# Patient Record
Sex: Female | Born: 1990 | Hispanic: Yes | Marital: Single | Smoking: Never smoker
Health system: Southern US, Community
[De-identification: ages and names within clinical notes are randomized; demographics above are authoritative.]

## PROBLEM LIST (undated history)

## (undated) ENCOUNTER — Inpatient Hospital Stay: Payer: Self-pay

---

## 2014-06-05 ENCOUNTER — Emergency Department
Admission: EM | Admit: 2014-06-05 | Discharge: 2014-06-06 | Disposition: A | Discharge status: Home / Self Care | Payer: Medicaid Other | Attending: Emergency Medicine | Admitting: Emergency Medicine

## 2014-06-05 ENCOUNTER — Encounter: Payer: Self-pay | Admitting: Emergency Medicine

## 2014-06-05 ENCOUNTER — Emergency Department: Payer: Medicaid Other

## 2014-06-05 DIAGNOSIS — W540XXA Bitten by dog, initial encounter: Secondary | ICD-10-CM | POA: Diagnosis not present

## 2014-06-05 DIAGNOSIS — Y9289 Other specified places as the place of occurrence of the external cause: Secondary | ICD-10-CM | POA: Diagnosis not present

## 2014-06-05 DIAGNOSIS — S81852A Open bite, left lower leg, initial encounter: Secondary | ICD-10-CM | POA: Diagnosis present

## 2014-06-05 DIAGNOSIS — S81832A Puncture wound without foreign body, left lower leg, initial encounter: Secondary | ICD-10-CM | POA: Diagnosis not present

## 2014-06-05 DIAGNOSIS — Y998 Other external cause status: Secondary | ICD-10-CM | POA: Insufficient documentation

## 2014-06-05 DIAGNOSIS — Y9389 Activity, other specified: Secondary | ICD-10-CM | POA: Diagnosis not present

## 2014-06-05 DIAGNOSIS — Z23 Encounter for immunization: Secondary | ICD-10-CM | POA: Diagnosis not present

## 2014-06-05 DIAGNOSIS — T07XXXA Unspecified multiple injuries, initial encounter: Secondary | ICD-10-CM

## 2014-06-05 MED ORDER — IBUPROFEN 800 MG PO TABS: 800.0000 mg | ORAL_TABLET | Freq: Three times a day (TID) | ORAL | Status: AC | PRN

## 2014-06-05 MED ORDER — AMOXICILLIN-POT CLAVULANATE 875-125 MG PO TABS
1.0000 | ORAL_TABLET | Freq: Once | ORAL | Status: AC
  Administered 2014-06-05: 1 via ORAL

## 2014-06-05 MED ORDER — AMOXICILLIN-POT CLAVULANATE 875-125 MG PO TABS
ORAL_TABLET | ORAL | Status: AC
  Administered 2014-06-05: 1 via ORAL
  Filled 2014-06-05: qty 1

## 2014-06-05 MED ORDER — RABIES VACCINE, PCEC IM SUSR
INTRAMUSCULAR | Status: AC
  Administered 2014-06-05: 1 mL via INTRAMUSCULAR
  Filled 2014-06-05: qty 1

## 2014-06-05 MED ORDER — AMOXICILLIN-POT CLAVULANATE 875-125 MG PO TABS: 1.0000 | ORAL_TABLET | Freq: Two times a day (BID) | ORAL | Status: AC

## 2014-06-05 MED ORDER — RABIES VACCINE, PCEC IM SUSR
1.0000 mL | Freq: Once | INTRAMUSCULAR | Status: AC
  Administered 2014-06-05: 1 mL via INTRAMUSCULAR

## 2014-06-05 MED ORDER — IBUPROFEN 800 MG PO TABS
800.0000 mg | ORAL_TABLET | Freq: Once | ORAL | Status: AC
  Administered 2014-06-05: 800 mg via ORAL

## 2014-06-05 MED ORDER — RABIES IMMUNE GLOBULIN 150 UNIT/ML IM INJ
20.0000 [IU]/kg | INJECTION | Freq: Once | INTRAMUSCULAR | Status: AC
  Administered 2014-06-05: 1425 [IU] via INTRAMUSCULAR
  Filled 2014-06-05: qty 9.5

## 2014-06-05 MED ORDER — IBUPROFEN 800 MG PO TABS
ORAL_TABLET | ORAL | Status: AC
  Administered 2014-06-05: 800 mg via ORAL
  Filled 2014-06-05: qty 1

## 2014-06-05 NOTE — ED Provider Notes (Signed)
[facility] Emergency Department Provider Note    ____________________________________________  Time seen: 2230 I have reviewed the triage vital signs and the nursing notes.   HISTORY  Chief Complaint Animal Bite       HPI Caroline SparrowClaudia Garay is a 24 y.o. female who was bitten by a stray dog to the left leg just prior to arrival in the department has multiple puncture wounds to her left leg no bleeding at this time rates pain as about a 4-5 out of 10 at the most only hurts when she moves it relieved by just sitting still she cleaned and dressed the wound prior to arrival and has no other associated signs or symptoms is moving all extremities without difficulty     History reviewed. No pertinent past medical history.  There are no active problems to display for this patient.   Past Surgical History  Procedure Laterality Date  . Cesarean section      Current Outpatient Rx  Name  Route  Sig  Dispense  Refill  . amoxicillin-clavulanate (AUGMENTIN) 875-125 MG per tablet   Oral   Take 1 tablet by mouth 2 (two) times daily.   14 tablet   0   . ibuprofen (ADVIL,MOTRIN) 800 MG tablet   Oral   Take 1 tablet (800 mg total) by mouth every 8 (eight) hours as needed.   30 tablet   0     Allergies Review of patient's allergies indicates no known allergies.  History reviewed. No pertinent family history.  Social History History  Substance Use Topics  . Smoking status: Never Smoker   . Smokeless tobacco: Not on file  . Alcohol Use: No    Review of Systems  He is systems negative 6 systems as reviewed the patient's except for noted the history of present illness  ____________________________________________   PHYSICAL EXAM:  VITAL SIGNS: ED Triage Vitals  Enc Vitals Group     BP 06/05/14 2120 110/70 mmHg     Pulse Rate 06/05/14 2120 73     Resp 06/05/14 2120 20     Temp 06/05/14 2120 98.2 F (36.8 C)     Temp Source 06/05/14 2120 Oral      SpO2 06/05/14 2120 100 %     Weight 06/05/14 2120 154 lb (69.854 kg)     Height 06/05/14 2120 5\' 5"  (1.651 m)     Head Cir --      Peak Flow --      Pain Score 06/05/14 2121 5     Pain Loc --      Pain Edu? --      Excl. in GC? --    This was an Hispanic female appearing stated age well-developed well-nourished no acute distress vitals reviewed with nurse's head ears eyes nose and throat exam was negative  cardiovascular regular rate and rhythm no murmurs rubs gallops  pulmonary lungs process patient bilaterally musculoskeletal she has full range of motion all extremities multiple puncture wounds to the dorsal aspect of the left calf Achilles is intact she has full range of motion good sensations able to [facility] without difficulty Neuro exam is nonfocal good sensation strength equal and symmetrical bilaterally Psychologically no acute distress acting normal Skin multiple puncture wounds to the left leg    RADIOLOGY  For the patient's left leg was negative for foreign body or bony abnormality  ____________________________________________   PROCEDURES  Procedure(s) performed: None  ____________________________________________   INITIAL IMPRESSION / ASSESSMENT AND PLAN /  ED COURSE  Pertinent labs & imaging results that were available during my care of the patient were reviewed by me and considered in my medical decision making (see chart for details).  Impression dog bite multiple puncture wounds of the left leg rabies series was started patient was started on Augmentin is to follow-up for completion rabies series and complete all antibiotics  ____________________________________________   FINAL CLINICAL IMPRESSION(S) / ED DIAGNOSES  Final diagnoses:  Dog bite  Multiple puncture wounds     Graeden Bitner Rosalyn GessWilliam C Jorgina Binning, PA-C 06/05/14 2255  Sharman CheekPhillip Stafford, MD 06/06/14 0040

## 2014-06-05 NOTE — Discharge Instructions (Signed)
Mordedura de Engineer, maintenanceanimales  (LobbyistAnimal Bite) La mordedura de Corporate investment bankerun animal puede producir un rasguo de la piel, un corte abierto profundo, una puncin, una lesin por compresin o un desgarro de la piel o de una parte del cuerpo. Los perros son los responsables de la mayora de las mordeduras. Los nios son atacados con ms frecuencia que los adultos. La mordedura de un animal puede ser leve o llegar a ser grave. Una mordedura pequea causada por una mascota no es causa de alarma. Sin embargo, algunas pueden infectarse o llegar a Engineer, drillinglesionar un hueso u otros tejidos. Debe solicitar asistencia mdica si:   La piel est abierta y el sangrado no se detiene ni disminuye despus de 15 minutos.  La puncin es profunda y difcil de limpiar (como en el caso de la mordedura de un Hager Citygato).  La herida le duele, est caliente, roja o supura pus.  La mordedura la hizo un Haematologistanimal callejero o un roedor. Puede haber riesgo de infeccin por rabia.  La mordedura la hizo una serpiente, un mapache, zorrino, Chief Financial Officerzorro, Tour managercoyote o murcilago. Puede haber riesgo de infeccin por rabia.  La persona que sufri la mordedura tiene una enfermedad crnica como diabetes, enfermedad heptica o cncer, o toma medicamentos que disminuyen la accin del sistema inmunolgico.  Hay preocupacin por la ubicacin y la gravedad de la mordedura. Es importante limpiar y proteger la zona de la herida inmediatamente, para evitar la infeccin. Siga estos pasos:   Limpie la herida con abundante agua y [place]jabn.  Baruch Goutyubra con una crema con antibitico.  Aplique una suave presin sobre la herida con una toalla o una gasa limpia para disminuir o detener el sangrado.  Eleve la zona afectada por arriba del nivel del corazn para Associate Professordetener hemorragias.  Pida ayuda mdica. Si recibe asistencia mdica dentro de las 8 horas de la Entergy Corporationmordedura los resultados sern mejores. DIAGNSTICO  El mdico:   Tomar una historia detallada del animal y de la lesin por la  mordedura.  Har un examen de la herida.  Realizar una historia clnica. Indicar anlisis o radiografas. En algunos casos se toma una muestra de la herida infectada y se enva al laboratorio para identificar la bacteria que produjo la infeccin.  TRATAMIENTO  El tratamiento mdico depender de la ubicacin y el tipo de mordedura, as como de la historia clnica del [place]paciente. El tratamiento podr incluir:   Cuidados de la herida, como limpieza y enjuague con solucin fisiolgica, vendaje y la elevacin de la zona afectada.  Antibiticos.  Vacuna antitetnica.  Madilyn FiremanVacuna antirrbica.  Dejar la herida abierta para que se cure. Generalmente sto se hace debido al riesgo de infeccin. Sin embargo, en ciertos casos, la herida se cierra con puntos, Turner Danielsadhesivo para heridas, tiras GNFAOZHYQadhesivas para la piel o grapas. Las heridas infectadas que no se tratan pueden requerir antibiticos por va intravenosa (IV) y tratamiento quirrgico en el hospital.  INSTRUCCIONES PARA EL CUIDADO EN EL HOGAR   Siga las indicaciones del profesional para el cuidado de las heridas.  [employer] todos los medicamentos tal como se los han indicado.  Si el profesional que lo asiste le prescribe antibiticos, tmelos tal como se le indic. Tmelos todos, aunque se sienta mejor.  Concurra a las visitas de control con el mdico para Wellsite geologistrealizar pruebas adicionales, o recibir vacunas, segn las indicaciones. Deber aplicarse la vacuna contra el ttanos si:  No recuerda cundo se coloc la vacuna la ltima vez.  Nunca recibi esta vacuna.  La lesin ha Air Products and Chemicalsabierto su  piel. Si le han aplicado la vacuna contra el ttanos, el brazo podr hincharse, enrojecer y sentirse caliente al tacto. Esto es frecuente y no es un problema. Si usted necesita aplicarse la vacuna y se niega a recibirla, corre riesgo de contraer ttanos. Esta es una enfermedad que puede ser grave. SOLICITE ATENCIN MDICA SI:   La herida est caliente, roja, hinchada, le  duele, supura pus o tiene Reynolds Americanfeo olor.  Hay una lnea roja en la piel que sale desde la herida.  Tiene fiebre, escalofros, o una sensacin general de Dentistmalestar.  Tiene nuseas o vmitos.  Siente dolor continuo o que Abbs Valleyempeora.  Tiene dificultad para mover la zona lesionada.  Tiene otras preguntas o preocupaciones. ASEGRESE DE QUE:   Comprende estas instrucciones.  Controlar su enfermedad.  Solicitar ayuda de inmediato si no mejora o si empeora. Document Released: 01/03/2011 Document Revised: 04/08/2011 [facility] Patient Information 2015 [employer], [place]. This information is not intended to replace advice given to you by your health care provider. Make sure you discuss any questions you have with your health care provider.

## 2014-06-05 NOTE — ED Notes (Signed)
Pt presents to ER alert and in NAD. Via interpreter pt states she was getting in car and was attacked by stray dog. Pt has various bite marks noted to left calf. Bleeding controlled.

## 2014-06-06 MED ORDER — BACITRACIN-NEOMYCIN-POLYMYXIN 400-5-5000 EX OINT
TOPICAL_OINTMENT | CUTANEOUS | Status: AC
  Filled 2014-06-06: qty 1

## 2014-09-28 DIAGNOSIS — Z0279 Encounter for issue of other medical certificate: Secondary | ICD-10-CM

## 2015-02-16 ENCOUNTER — Other Ambulatory Visit: Payer: Self-pay | Admitting: Advanced Practice Midwife

## 2015-02-16 DIAGNOSIS — Z3481 Encounter for supervision of other normal pregnancy, first trimester: Secondary | ICD-10-CM

## 2015-02-21 ENCOUNTER — Ambulatory Visit
Admission: RE | Admit: 2015-02-21 | Discharge: 2015-02-21 | Disposition: A | Discharge status: Home / Self Care | Payer: Medicaid Other | Source: Ambulatory Visit | Attending: Advanced Practice Midwife | Admitting: Advanced Practice Midwife

## 2015-02-21 DIAGNOSIS — Z3481 Encounter for supervision of other normal pregnancy, first trimester: Secondary | ICD-10-CM | POA: Insufficient documentation

## 2015-02-21 DIAGNOSIS — Z3A13 13 weeks gestation of pregnancy: Secondary | ICD-10-CM | POA: Insufficient documentation

## 2015-02-22 DIAGNOSIS — R87619 Unspecified abnormal cytological findings in specimens from cervix uteri: Secondary | ICD-10-CM | POA: Insufficient documentation

## 2015-04-16 ENCOUNTER — Observation Stay
Admission: EM | Admit: 2015-04-16 | Discharge: 2015-04-16 | Disposition: A | Discharge status: Home / Self Care | Payer: Medicaid Other | Attending: Obstetrics & Gynecology | Admitting: Obstetrics & Gynecology

## 2015-04-16 ENCOUNTER — Encounter: Payer: Self-pay | Admitting: *Deleted

## 2015-04-16 DIAGNOSIS — O2342 Unspecified infection of urinary tract in pregnancy, second trimester: Principal | ICD-10-CM | POA: Insufficient documentation

## 2015-04-16 DIAGNOSIS — O26892 Other specified pregnancy related conditions, second trimester: Secondary | ICD-10-CM | POA: Diagnosis present

## 2015-04-16 DIAGNOSIS — Z3A22 22 weeks gestation of pregnancy: Secondary | ICD-10-CM | POA: Insufficient documentation

## 2015-04-16 DIAGNOSIS — M545 Low back pain, unspecified: Secondary | ICD-10-CM | POA: Diagnosis present

## 2015-04-16 LAB — URINALYSIS COMPLETE WITH MICROSCOPIC (ARMC ONLY)
BILIRUBIN URINE: NEGATIVE
Glucose, UA: NEGATIVE mg/dL
Nitrite: NEGATIVE
PROTEIN: NEGATIVE mg/dL
Specific Gravity, Urine: 1.025 (ref 1.005–1.030)
pH: 5 (ref 5.0–8.0)

## 2015-04-16 MED ORDER — ACETAMINOPHEN 325 MG PO TABS: 650.0000 mg | ORAL_TABLET | ORAL | Status: DC | PRN

## 2015-04-16 NOTE — Discharge Instructions (Signed)
Patient is agreeable to call on Monday  to schedule an appointment with her provider [facility].  If patient has any further questions or concerns she can call the on-call physician or the nurse's desk.

## 2015-04-16 NOTE — Final Progress Note (Signed)
Physician Final Progress Note  Patient ID: Jeanell SparrowClaudia Garay MRN: [id number] DOB/AGE: 24/04/1990 24 y.o.  Admit date: 04/16/2015 Admitting provider: Nadara Mustardobert P Shannen Flans[place], MD Discharge date: 04/16/2015   Admission Diagnoses: Low back pain in second trimester  Discharge Diagnoses:  Principal Problem:   Low back pain during pregnancy in second trimester  Significant Findings/ Diagnostic Studies: labs: See Urinalysis results  Procedures: None  Discharge Condition: good  Disposition: 01-Home or Self Care  Diet: Regular diet  Discharge Activity: Activity as tolerated     Medication List    ASK your doctor about these medications        Tylenol for pain Macrobid Antibiotic for urinary tract infection.            Follow-up Information    [facility] Go in 1 week.   Why:  As Scheduled   Contact information:   [address] [place] [id number] [phone]       Total time spent taking care of this patient: 15 minutes  Signed: Letitia Libraobert Paul Tamberlyn Midgley 04/16/2015, 9:59 AM

## 2015-07-21 DIAGNOSIS — E663 Overweight: Secondary | ICD-10-CM | POA: Insufficient documentation

## 2016-02-19 ENCOUNTER — Encounter (HOSPITAL_COMMUNITY): Payer: Self-pay

## 2017-03-02 IMAGING — US US OB COMP LESS 14 WK
2 series · 14 of 28 positions shown · non-contrast
Comparison: No prior.

CLINICAL DATA: Pregnancy.

EXAM:
OBSTETRIC <14 WK US AND TRANSVAGINAL OB US
TECHNIQUE: Both transabdominal and transvaginal ultrasound examinations were
performed for complete evaluation of the gestation as well as the
maternal uterus, adnexal regions, and pelvic cul-de-sac.
Transvaginal technique was performed to assess early pregnancy.

[Series 1: us ob comp less 14 wk · 0.30mm/px · 13 of 33 slices shown (1 of 2)]
[im 2/33]
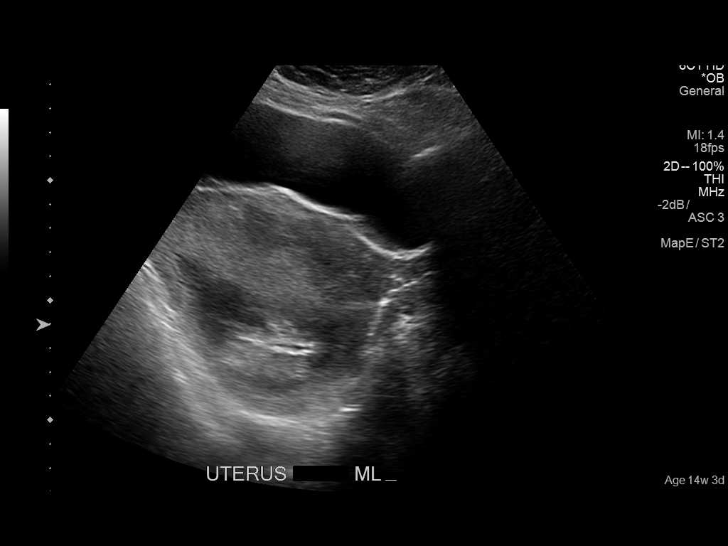
[im 4/33]
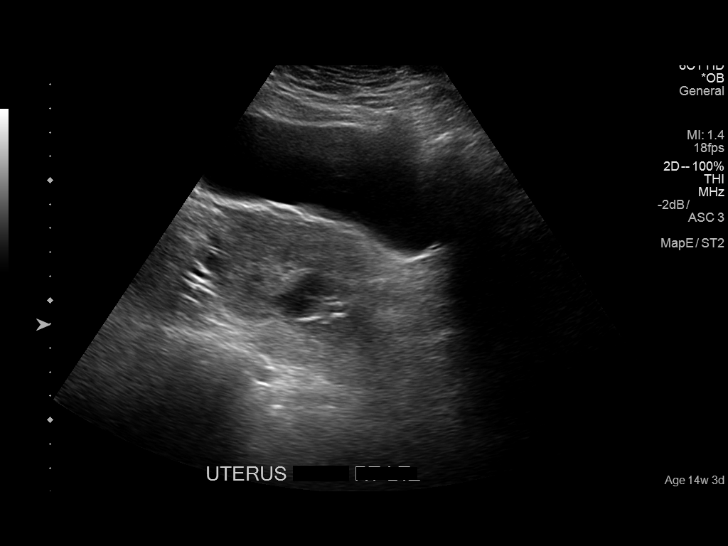
[im 7/33]
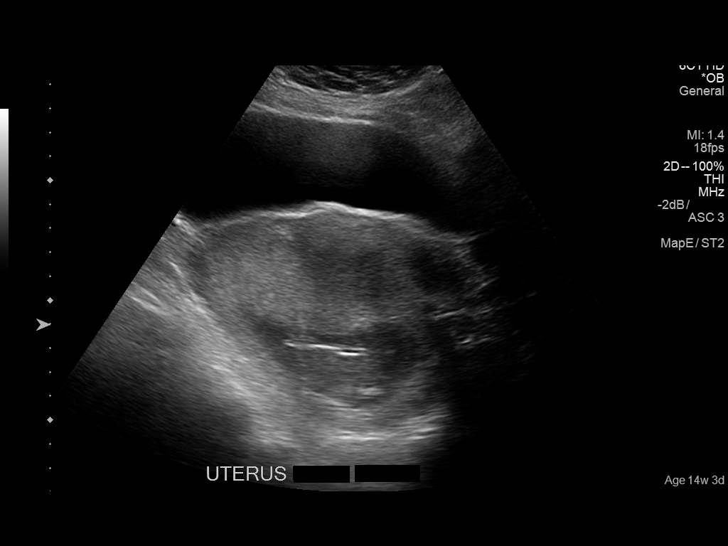
[im 9/33]
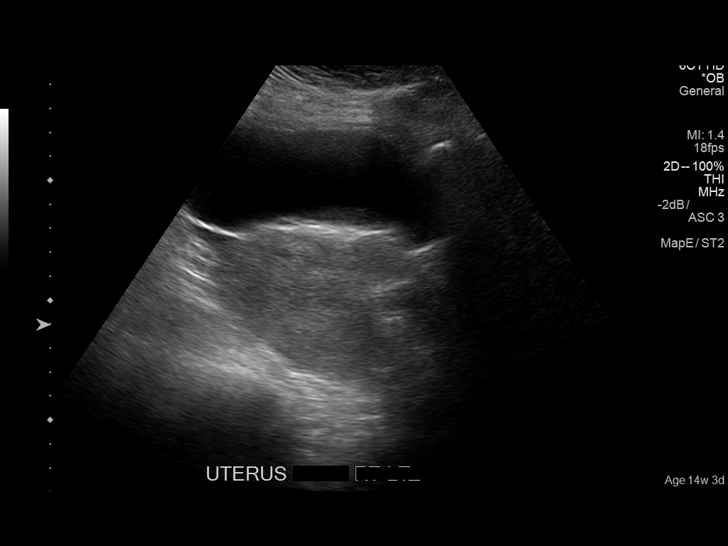
[im 12/33]
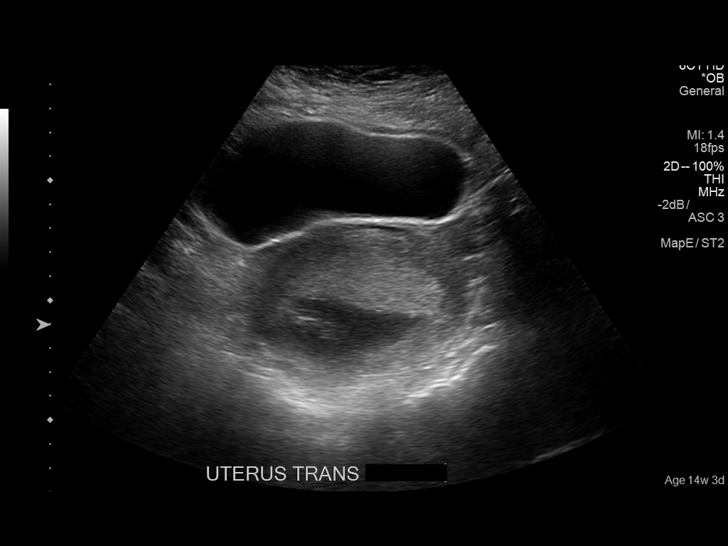
[im 15/33]
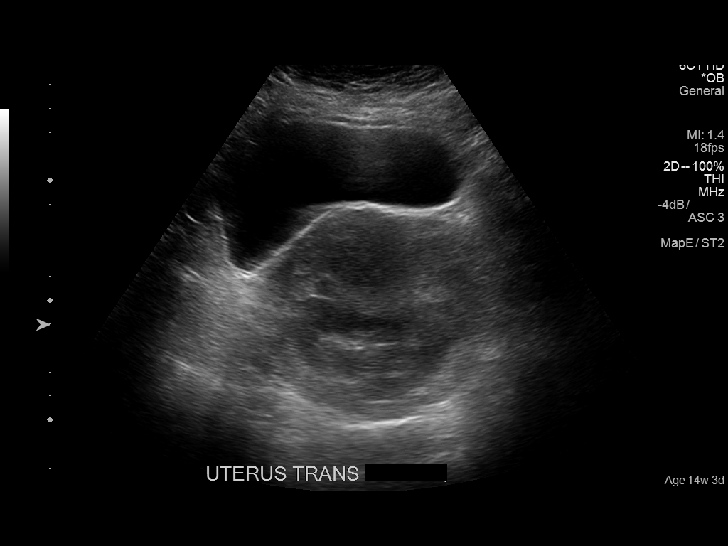
[im 17/33]
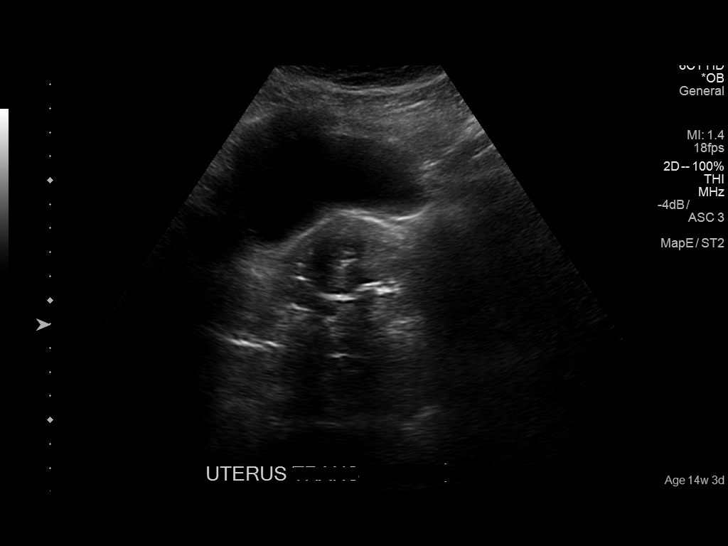
[im 20/33]
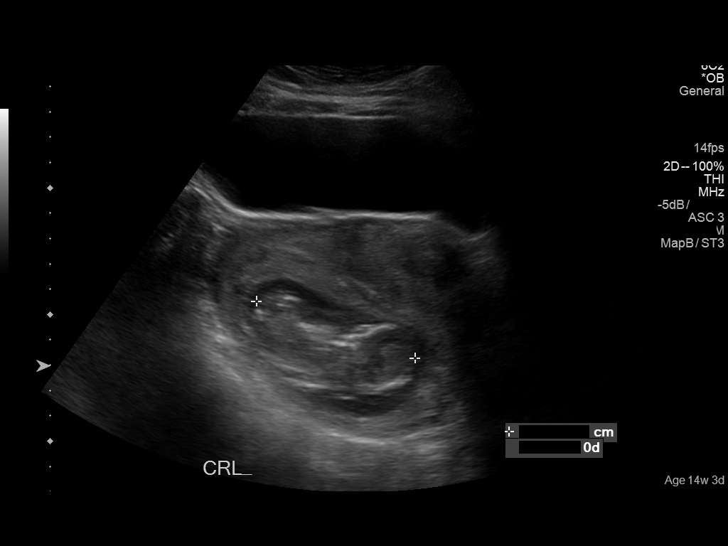
[im 22/33]
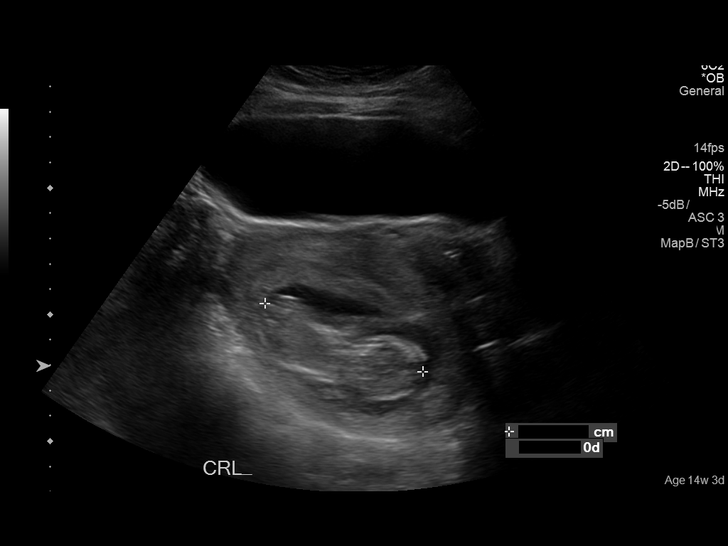
[im 25/33]
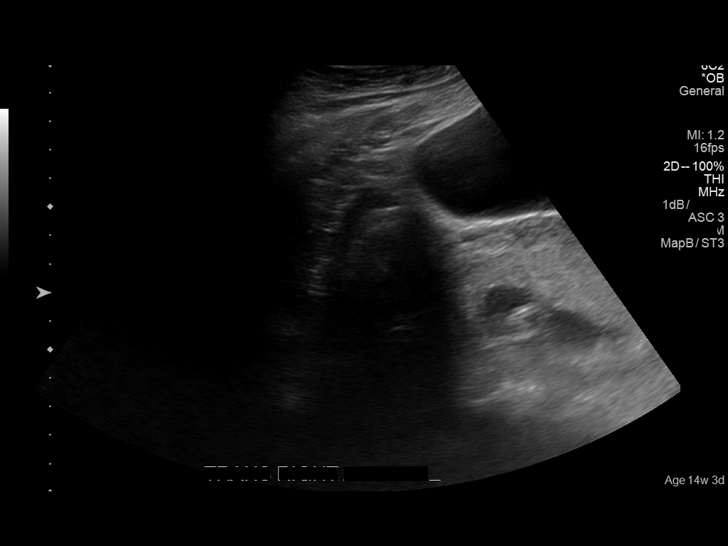
[im 27/33]
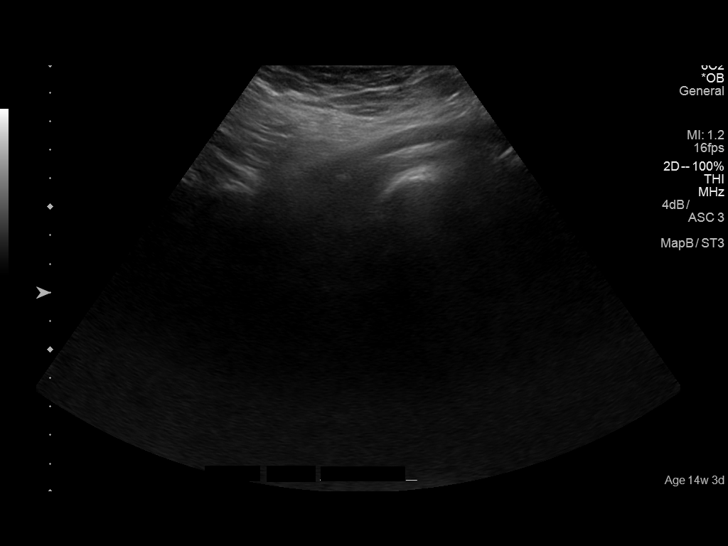
[im 30/33]
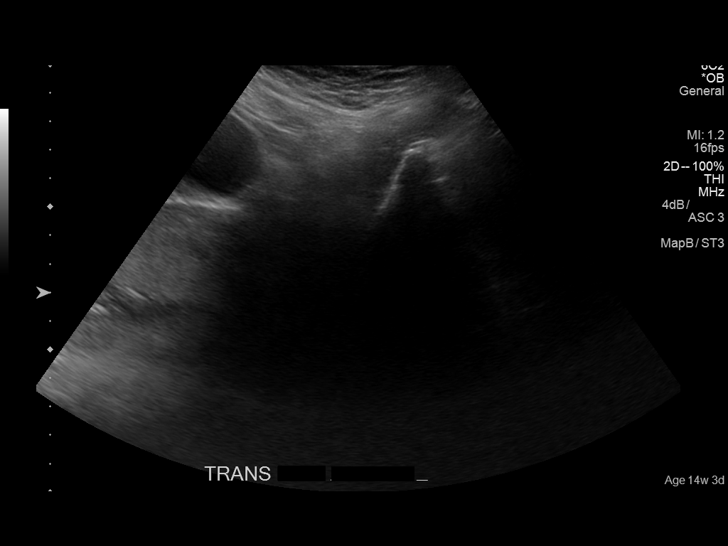
[im 33/33]
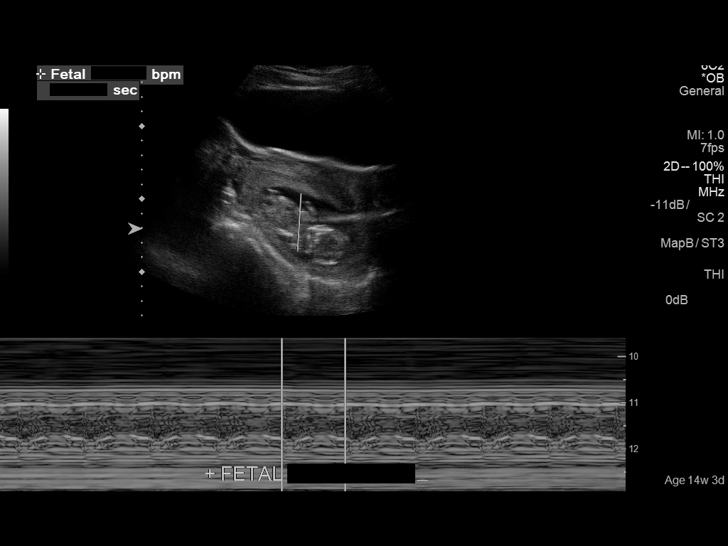

[Series 1001: us ob comp less 14 wk · 0.30mm/px · 1 of 2 slices shown (2 of 2)]
[im 2/2]
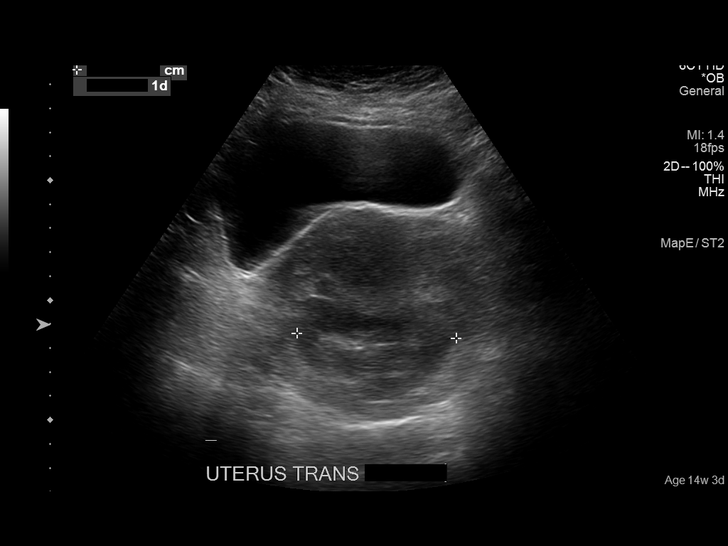

[14 of 28 positions shown; findings below may reference images not displayed]

FINDINGS: Intrauterine gestational sac: Visualized/normal in shape.

Yolk sac:  None visualized.

Embryo:  Visualized.

Cardiac Activity: Visualized.

Heart Rate: 162  bpm

CRL:  6.8  mm   13 w   0 d                  US EDC: 08/29/2015

Subchorionic hemorrhage:  Not visualized.

Maternal uterus/adnexae: Poorly visualized. Limited exam due to
patient's body habitus.
IMPRESSION: Single viable intrauterine pregnancy at 13 weeks 0 days.

## 2017-03-11 LAB — HM PAP SMEAR: HM Pap smear: NEGATIVE

## 2017-03-11 LAB — HM HIV SCREENING LAB: HM HIV Screening: NEGATIVE

## 2019-03-10 ENCOUNTER — Ambulatory Visit (LOCAL_COMMUNITY_HEALTH_CENTER): Payer: Self-pay | Admitting: Advanced Practice Midwife

## 2019-03-10 ENCOUNTER — Other Ambulatory Visit: Payer: Self-pay

## 2019-03-10 ENCOUNTER — Encounter: Payer: Self-pay | Admitting: Advanced Practice Midwife

## 2019-03-10 VITALS — BP 106/66 | Ht 63.0 in | Wt 177.2 lb

## 2019-03-10 DIAGNOSIS — Z3009 Encounter for other general counseling and advice on contraception: Secondary | ICD-10-CM

## 2019-03-10 DIAGNOSIS — Z30011 Encounter for initial prescription of contraceptive pills: Secondary | ICD-10-CM

## 2019-03-10 DIAGNOSIS — R87610 Atypical squamous cells of undetermined significance on cytologic smear of cervix (ASC-US): Secondary | ICD-10-CM

## 2019-03-10 DIAGNOSIS — Z3046 Encounter for surveillance of implantable subdermal contraceptive: Secondary | ICD-10-CM

## 2019-03-10 DIAGNOSIS — E669 Obesity, unspecified: Secondary | ICD-10-CM

## 2019-03-10 LAB — HEMOGLOBIN, FINGERSTICK: Hemoglobin: 13.4 g/dL (ref 11.1–15.9)

## 2019-03-10 MED ORDER — NORGESTIM-ETH ESTRAD TRIPHASIC 0.18/0.215/0.25 MG-35 MCG PO TABS
1.0000 | ORAL_TABLET | Freq: Every day | ORAL | 3 refills | Status: DC
Start: 2019-03-10 — End: 2019-05-21

## 2019-03-10 NOTE — Progress Notes (Signed)
Patient given 3 packs OC due to expiration date and patient counseled to call when she starts pack #3 to make appointment to pick up 3 more packs OTC. Patient counseled that after these 6 packs she will need PE. Patient states understanding.Burt Knack, RN

## 2019-03-10 NOTE — Progress Notes (Signed)
   [facility] problem [facility]  [facility]  Subjective:  Caroline Cordova is a 29 y.o. MHF G3P3 nonsmoker being seen today for Nexplanon removal and ocp initiation  Chief Complaint  Patient presents with  . Contraception    nexplanon removed, wants OC    HPI Nexplanon inserted 09/20/15.  Last sex 02/10/19.  LMP 02/11/19.  Last pap 03/11/2017 neg but hx ASCUS 02/16/15 and neg 03/06/16 Last physical 02/2017  Does the patient have a current or past history of drug use? No   No components found for: HCV]   Health Maintenance Due  Topic Date Due  . TETANUS/TDAP  06/01/2010  . INFLUENZA VACCINE  08/29/2018    ROS  The following portions of the patient's history were reviewed and updated as appropriate: allergies, current medications, past family history, past medical history, past social history, past surgical history and problem list. Problem list updated.   See flowsheet for other program required questions.  Objective:   Vitals:   03/10/19 0858  BP: 106/66  Weight: 177 lb 3.2 oz (80.4 kg)  Height: 5\' 3"  (1.6 m)    Physical Exam  n/a  Assessment and Plan:  Caroline Cordova is a 29 y.o. female presenting to the [facility] Department for a Women's Health problem visit  1. Obesity, unspecified classification, unspecified obesity type, unspecified whether serious comorbidity present   2. Atypical squamous cells of undetermined significance on cytologic smear of cervix (ASC-US) Needs pap 2022  3. Family planning Nexplanon Removal Patient identified, informed consent performed, consent signed.   Appropriate time out taken. Nexplanon site identified.  Area prepped in usual sterile fashon. 3 ml of 1% lidocaine with Epinephrine was used to anesthetize the area at the distal end of the implant and along implant site. A small stab incision was made right beside the implant on the distal portion.  The Nexplanon rod was grasped using  straighthemostats/manual and removed without difficulty.  There was minimal blood loss. There were no complications.  Steri-strips were applied over the small incision.  A pressure bandage was applied to reduce any bruising.  The patient tolerated the procedure well and was given post procedure instructions.  Nexplanon:   Counseled patient to take OTC analgesic starting as soon as lidocaine starts to wear off and take regularly for at least 48 hr to decrease discomfort.  Specifically to take with food or milk to decrease stomach upset and for IB 600 mg (3 tablets) every 6 hrs; IB 800 mg (4 tablets) every 8 hrs; or Aleve 2 tablets every 12 hrs.    - HIV [facility] - Syphilis Serology, [facility] - Hemoglobin, venipuncture  4. Encounter for initial prescription of contraceptive pills OTC #6 I po daily to begin today Please counsel on abstinance/back up condoms next 7 days Will need physical before more ocp's given Please give Covid immunization # for elderly >- 77 yo relatives/friends     Return in about 6 months (around 09/07/2019).  No future appointments.  11/07/2019, CNM

## 2019-03-10 NOTE — Progress Notes (Signed)
Here for Nexplanon removal. Inserted in 09/20/2015. Wants to start OC. Last PE 02/2017, last Pap 03/11/2017, negative. Patient states she has been bleeding since 02/11/2019 and is feeling dizzy all the time.Burt Knack, RN

## 2019-04-25 ENCOUNTER — Ambulatory Visit: Payer: Self-pay | Attending: Internal Medicine

## 2019-04-25 DIAGNOSIS — Z23 Encounter for immunization: Secondary | ICD-10-CM

## 2019-04-25 NOTE — Progress Notes (Signed)
   Covid-19 Vaccination Clinic  Name:  Antoinetta Berrones    MRN: [id number] DOB: 09-Oct-1990  04/25/2019  Ms. Rosalee Kaufman was observed post Covid-19 immunization for 15 minutes without incident. She was provided with Vaccine Information Sheet and instruction to access the V-Safe system.   Ms. Rosalee Kaufman was instructed to call 911 with any severe reactions post vaccine: Marland Kitchen Difficulty breathing  . Swelling of face and throat  . A fast heartbeat  . A bad rash all over body  . Dizziness and weakness   Immunizations Administered    Name Date Dose VIS Date Route   Pfizer COVID-19 Vaccine 04/25/2019  5:06 PM 0.3 mL 01/08/2019 Intramuscular   Manufacturer: [employer], [employer]   Lot: [id number]   NDC: [address]-4

## 2019-04-26 ENCOUNTER — Ambulatory Visit: Payer: Self-pay

## 2019-05-16 ENCOUNTER — Ambulatory Visit: Payer: Self-pay | Attending: Internal Medicine

## 2019-05-16 DIAGNOSIS — Z23 Encounter for immunization: Secondary | ICD-10-CM

## 2019-05-16 NOTE — Progress Notes (Signed)
   Covid-19 Vaccination Clinic  Name:  Caroline Cordova    MRN: [id number] DOB: May 20, 1990  05/16/2019  Caroline Cordova was observed post Covid-19 immunization for 15 minutes without incident. She was provided with Vaccine Information Sheet and instruction to access the V-Safe system.   Caroline Cordova was instructed to call 911 with any severe reactions post vaccine: Marland Kitchen Difficulty breathing  . Swelling of face and throat  . A fast heartbeat  . A bad rash all over body  . Dizziness and weakness   Immunizations Administered    Name Date Dose VIS Date Route   Pfizer COVID-19 Vaccine 05/16/2019  4:44 PM 0.3 mL 01/08/2019 Intramuscular   Manufacturer: [employer], [employer]   Lot: [id number]   NDC: [address]-5

## 2019-05-21 ENCOUNTER — Other Ambulatory Visit: Payer: Self-pay

## 2019-05-21 ENCOUNTER — Ambulatory Visit (LOCAL_COMMUNITY_HEALTH_CENTER): Payer: Self-pay

## 2019-05-21 ENCOUNTER — Telehealth: Payer: Self-pay

## 2019-05-21 VITALS — BP 105/73 | Ht 63.0 in | Wt 174.0 lb

## 2019-05-21 DIAGNOSIS — Z30011 Encounter for initial prescription of contraceptive pills: Secondary | ICD-10-CM

## 2019-05-21 DIAGNOSIS — Z3009 Encounter for other general counseling and advice on contraception: Secondary | ICD-10-CM

## 2019-05-21 MED ORDER — MULTI-VITAMIN/MINERALS PO TABS: 1.0000 | ORAL_TABLET | Freq: Every day | ORAL | 0 refills | Status: AC

## 2019-05-21 MED ORDER — NORGESTIM-ETH ESTRAD TRIPHASIC 0.18/0.215/0.25 MG-35 MCG PO TABS: 1.0000 | ORAL_TABLET | Freq: Every day | ORAL | 11 refills | Status: AC

## 2019-05-21 NOTE — Telephone Encounter (Signed)
Client in Nurse Clinic this pm and dispensed 3 packs ocps. After reviewing documentation, RN realized decreased number of pill packs  should have been dispensed due to expiration date of 07/28/2019. Call to client with [facility] interpreters (ID # [phone]) and left message to call regarding pills / Jue expiration date. RN will attempt to contact client on 05/24/2019. Jossie Ng, RN

## 2019-05-21 NOTE — Progress Notes (Signed)
Client has taken pills late 2 times (took later in day than usual, did not miss pill). Encouraged to set alarm on cell phone to assist in remembering to take ocps. Folic acid counseling completed and MVI dispensed. Client counseled to schedule physical appt when begins last pack of pills given today and she verbalized understanding. Jossie Ng, RN

## 2019-05-24 ENCOUNTER — Telehealth: Payer: Self-pay

## 2019-05-24 NOTE — Telephone Encounter (Addendum)
Call to schedule client appt to return to Nurse Clinic and bring 3 packs birth control pills given to her on Friday 05/21/2019 as need to change to pills with later expiration date (pills expire 07/28/2019). Message left with number to call and M. Yemen interpreted during call. While documenting this note, client returned call and above explained. Appt scheduled for 05/31/2019 in Nurse Clinic. M. Yemen interpreted during phone call. Jossie Ng, RN

## 2019-05-31 ENCOUNTER — Ambulatory Visit (LOCAL_COMMUNITY_HEALTH_CENTER): Payer: Self-pay

## 2019-05-31 ENCOUNTER — Other Ambulatory Visit: Payer: Self-pay

## 2019-05-31 DIAGNOSIS — Z3009 Encounter for other general counseling and advice on contraception: Secondary | ICD-10-CM

## 2019-05-31 DIAGNOSIS — Z3041 Encounter for surveillance of contraceptive pills: Secondary | ICD-10-CM

## 2019-05-31 MED ORDER — NORGESTIM-ETH ESTRAD TRIPHASIC 0.18/0.215/0.25 MG-35 MCG PO TABS: 1.0000 | ORAL_TABLET | Freq: Every day | ORAL | 0 refills | Status: AC

## 2019-05-31 NOTE — Progress Notes (Signed)
See RN notes dated 05/21/19 and 05/24/19.  Pt to RN clinic on 05/31/19; pt returned 3 packs of Tri Sprintec that expire June 2021.  Dispensed #3 packs of Tri Sprintec to pt that have expire date of 01/28/2020; this completes Arnetha Courser, CNM order dated 03/10/19. Pt to call when she opens her last pack of OCP to schedule physical.  Language Line used for interpreter services.

## 2020-02-08 ENCOUNTER — Other Ambulatory Visit (HOSPITAL_COMMUNITY): Payer: Self-pay | Admitting: Family Medicine

## 2020-02-08 ENCOUNTER — Other Ambulatory Visit: Payer: Self-pay | Admitting: Family Medicine

## 2020-02-08 DIAGNOSIS — Z3480 Encounter for supervision of other normal pregnancy, unspecified trimester: Secondary | ICD-10-CM

## 2020-02-15 ENCOUNTER — Other Ambulatory Visit: Payer: Self-pay

## 2020-02-15 ENCOUNTER — Ambulatory Visit
Admission: RE | Admit: 2020-02-15 | Discharge: 2020-02-15 | Disposition: A | Discharge status: Home / Self Care | Payer: Self-pay | Source: Ambulatory Visit | Attending: Family Medicine | Admitting: Family Medicine

## 2020-02-15 DIAGNOSIS — Z3480 Encounter for supervision of other normal pregnancy, unspecified trimester: Secondary | ICD-10-CM | POA: Insufficient documentation
